# Patient Record
Sex: Male | Born: 1995 | Race: White | Hispanic: No | Marital: Single | Smoking: Never smoker
Health system: Southern US, Community
[De-identification: ages and names within clinical notes are randomized; demographics above are authoritative.]

## PROBLEM LIST (undated history)

## (undated) DIAGNOSIS — T7840XA Allergy, unspecified, initial encounter: Secondary | ICD-10-CM

## (undated) HISTORY — PX: OTHER SURGICAL HISTORY: SHX169

## (undated) HISTORY — DX: Allergy, unspecified, initial encounter: T78.40XA

---
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
## 2014-05-19 ENCOUNTER — Ambulatory Visit (INDEPENDENT_AMBULATORY_CARE_PROVIDER_SITE_OTHER): Payer: BC Managed Care – PPO | Admitting: Family

## 2014-05-19 VITALS — BP 120/80 | HR 70 | Temp 97.8°F | Wt 140.0 lb

## 2014-05-19 DIAGNOSIS — A084 Viral intestinal infection, unspecified: Secondary | ICD-10-CM

## 2014-05-19 DIAGNOSIS — A088 Other specified intestinal infections: Secondary | ICD-10-CM

## 2014-05-19 MED ORDER — ONDANSETRON 4 MG PO TBDP: 4.0000 mg | ORAL_TABLET | Freq: Three times a day (TID) | ORAL | Status: DC | PRN

## 2014-05-19 NOTE — Progress Notes (Signed)
   Subjective:    Patient ID: Zachary Preston, male    DOB: [birth year], 18 y.o.   MRN: [id number]  HPI  Zachary Preston is a 18 yr old male who presents today with chief complaint of abdominal pain. Reports that symptoms started around dinner time 4 days ago.  Developed associated anorexia/nausea.  Reports that during the day he has been "OK" .  Has been taking fluids throughout the day, but nausea worsens at night.  At night he has had some episodes of vomiting. Reports no known fever. Reports BM's have been soft, but denies diarrhea.  He denies current abdominal pain or known sick contacts.     Review of Systems See HPI  Past Medical History  Diagnosis Date  . Allergy     History   Social History  . Marital Status: Single    Spouse Name: N/A    Number of Children: N/A  . Years of Education: N/A   Occupational History  . Not on file.   Social History Main Topics  . Smoking status: Never Smoker   . Smokeless tobacco: Not on file  . Alcohol Use: Not on file  . Drug Use: Not on file  . Sexual Activity: Not on file   Other Topics Concern  . Not on file   Social History Narrative  . No narrative on file    Past Surgical History  Procedure Laterality Date  . Tonsillectomy    . Arm fracture      No family history on file.  No Known Allergies  Current Outpatient Prescriptions on File Prior to Visit  Medication Sig Dispense Refill  . fexofenadine (ALLEGRA) 180 MG tablet Take 180 mg by mouth daily.       No current facility-administered medications on file prior to visit.    BP 120/80  Pulse 70  Temp(Src) 97.8 F (36.6 C) (Oral)  Wt 140 lb (63.504 kg)  SpO2 97%       Objective:   Physical Exam  Constitutional: He is oriented to person, place, and time. He appears well-developed and well-nourished. No distress.  Cardiovascular: Normal rate and regular rhythm.   No murmur heard. Pulmonary/Chest: Effort normal and breath sounds normal. No respiratory distress.  He has no wheezes. He has no rales. He exhibits no tenderness.  Neurological: He is alert and oriented to person, place, and time.  Psychiatric: He has a normal mood and affect. His behavior is normal. Judgment and thought content normal.          Assessment & Plan:

## 2014-05-19 NOTE — Patient Instructions (Signed)

## 2014-05-20 DIAGNOSIS — A084 Viral intestinal infection, unspecified: Secondary | ICD-10-CM | POA: Insufficient documentation

## 2014-05-20 NOTE — Assessment & Plan Note (Signed)
Symptoms most consistent with resolving viral gastroenteritis.  Add zofran prn nausea, follow up if symptoms worsen or if symptoms do not continue to improve.

## 2014-06-11 ENCOUNTER — Telehealth: Payer: Self-pay | Admitting: Family Medicine

## 2014-06-11 NOTE — Telephone Encounter (Signed)
Lm on pts vm requesting a call back to schedule. If pts calls back please schedule him in 2 same-days slots on wednesday

## 2014-06-11 NOTE — Telephone Encounter (Signed)
Patient Information:  Caller Name: Martie LeeSabrina  Phone: [phone]  Patient: Zachary GasmanCaldwell, Zachary  Gender: Male  DOB: 1996/07/13  Age: 18 Years  PCP: Ruthe MannanAron, [facility](Family Practice)  Office Follow Up:  Does the office need to follow up with this patient?: Yes  Instructions For The Office: Nausea HS  RN Note:  Mom calling regarding child/Jamarien who is home from college.  c/o nausea which only occurs HS, but has been going on for past 3 weeks or more.  Mom is unsure if child is having stress issues about returning to school next week or if there is something else going on. Afebrile and denies any other symptoms.  Mom does note that child sleeps later in the day because he is up on computer until early hours of morning.  Requesting to be seen by Dr. Dayton MartesAron for evaluation.  Symptoms  Reason For Call & Symptoms: follow up regarding nausea HS  Reviewed Health History In EMR: Yes  Reviewed Medications In EMR: Yes  Reviewed Allergies In EMR: Yes  Reviewed Surgeries / Procedures: Yes  Date of Onset of Symptoms: 05/11/2014  Guideline(s) Used:  No Protocol Available - Sick Adult  Disposition Per Guideline:   Discuss with PCP and Callback by Nurse Today  Reason For Disposition Reached:   Nursing judgment  Advice Given:  Call Back If:  New symptoms develop  You become worse.  Patient Will Follow Care Advice:  YES

## 2014-06-11 NOTE — Telephone Encounter (Signed)
F/u this week with Dr. Dayton MartesAron

## 2014-06-11 NOTE — Telephone Encounter (Signed)
Zachary Preston said this needs to be a 30 min appt but we dont have any availability for 30 min, at all this week. When would you like to see him?

## 2014-06-11 NOTE — Telephone Encounter (Signed)
Per Okey Dupreose, pt contacted office and scheduled

## 2014-06-11 NOTE — Telephone Encounter (Signed)
Use two same days on weds.

## 2014-06-13 ENCOUNTER — Encounter: Payer: Self-pay | Admitting: Family Medicine

## 2014-06-13 ENCOUNTER — Ambulatory Visit (INDEPENDENT_AMBULATORY_CARE_PROVIDER_SITE_OTHER): Payer: BC Managed Care – PPO | Admitting: Family Medicine

## 2014-06-13 VITALS — BP 118/70 | HR 70 | Temp 97.7°F | Ht 66.5 in | Wt 136.8 lb

## 2014-06-13 DIAGNOSIS — R198 Other specified symptoms and signs involving the digestive system and abdomen: Secondary | ICD-10-CM | POA: Insufficient documentation

## 2014-06-13 DIAGNOSIS — R519 Headache, unspecified: Secondary | ICD-10-CM | POA: Insufficient documentation

## 2014-06-13 DIAGNOSIS — R112 Nausea with vomiting, unspecified: Secondary | ICD-10-CM

## 2014-06-13 DIAGNOSIS — R51 Headache: Secondary | ICD-10-CM

## 2014-06-13 NOTE — Progress Notes (Signed)
Pre visit review using our clinic review tool, if applicable. No additional management support is needed unless otherwise documented below in the visit note. 

## 2014-06-13 NOTE — Assessment & Plan Note (Signed)
New- migrainous in nature but has resolved. Advised to let me know if symptoms reoccur. The patient indicates understanding of these issues and agrees with the plan.

## 2014-06-13 NOTE — Assessment & Plan Note (Addendum)
Deteriorated. Unlikely infectious at this point. One month of nausea with increased frequency of BM (ribbon like) with mucous in stool and increased flatulence.  No blood in stool.  No family h/o IBD. ? IBS vs IBD IBS is a definite possibility given recent stressors. Will start Pepcid 20 mg daily x 2 weeks and start Align daily. Beano/gas x for bloating.  Symptoms journal to look for food triggers.  Refer to GI for possible colonoscopy/endoscopy to rule out Chrohns/UC if symptoms persist.  Consider phenergan prn nausea since zofran ineffective.  He is declining rx at this time.  Less likely "motion sickness" from video games- he has actually been playing less video games due to nausea.  The patient indicates understanding of these issues and agrees with the plan.

## 2014-06-13 NOTE — Assessment & Plan Note (Signed)
New- see above. 

## 2014-06-13 NOTE — Patient Instructions (Signed)
Great to see you. This could be IBS/irritable bowel syndrome but I would like to refer you to a gastroenterologist to rule out inflammatory bowel disease or other possible causes. We will call you with a referral.   Try minimizing stress and foods that worsen symptoms.    Try over the counter gas-x (four times a day when necessary) or beano for bloating.  Peppermint oil can sometimes help with symptoms as well.  Common culprits are hard-to-digest carbs called "FODMAPs"...fermentable, oligo-, di-, and mono-saccharides and polyols...in some fruits, veggies, milk, grains, etc.  Exclude gas producing foods (beans, onions, celery, carrots, raisins, bananas, apricots, prunes, brussel sprouts, wheat germ, pretzels)   Consider trail of lactose free diet (back off milk)   Trial of Align or VSL #3 for bloating/IBS symptoms (OTC).

## 2014-06-13 NOTE — Progress Notes (Addendum)
Subjective:   Patient ID: Zachary Preston, male    DOB: 06/04/1996, 18 y.o.   MRN: [id number]  Zachary Preston is a pleasant 18 y.o. year old male who presents to clinic today with Nausea and Headache  on 06/13/2014  HPI: I have not seen him since he established care with me in 11/2011.  His mom called our office and spoke to triage RN on 06/11/2014- see note in Epic. She was concerned about Zachary Preston who is home from college. He has been complaining of nausea with only occurs at night but has been ongoing for over 2 weeks.  He has been staying up late on his computer until early hours of the morning and sleeping in.  He tells me he is actually not spending as much time on his computer the past few weeks because of the nausea.  He used to play video games approx 6 hours a night- now he doesn't feel like playing them anymore.  Nausea occurs every night around 9 or 10 pm.  This does not start after playing video games. No food triggers he is aware of.  Sometimes vomits.  Bowels have changed as well- increased frequency of "ribbon like stool" but feels he is never having a good BM- still feels bloated and gasy after BM. +mucous in stool, no blood.  No fevers, no mouth ulcers, no epigastric burning or abdominal pain.  Was seen on 7/18 by Zachary Preston about a week after these symptoms started Note reviewed- she felt it was viral gastroenteritis and was given prn Zofran.  Zofran has not helped his symptoms at all.  Also complains of right sided headache with photophobia last two days- resolved this am.  Does take NSAIDs but not frequently.  No FH of IBD. Current Outpatient Prescriptions on File Prior to Visit  Medication Sig Dispense Refill  . fexofenadine (ALLEGRA) 180 MG tablet Take 180 mg by mouth daily.       No current facility-administered medications on file prior to visit.    No Known Allergies  Past Medical History  Diagnosis Date  . Allergy     Past Surgical History    Procedure Laterality Date  . Tonsillectomy    . Arm fracture      No family history on file.  History   Social History  . Marital Status: Single    Spouse Name: N/A    Number of Children: N/A  . Years of Education: N/A   Occupational History  . Not on file.   Social History Main Topics  . Smoking status: Never Smoker   . Smokeless tobacco: Not on file  . Alcohol Use: Not on file  . Drug Use: Not on file  . Sexual Activity: Not on file   Other Topics Concern  . Not on file   Social History Narrative  . No narrative on file   The PMH, PSH, Social History, Family History, Medications, and allergies [facility], and have been updated if relevant.     Review of Systems See HPI No joint pain +anxiety but he is not sure about what- is going back to college at [employer] next Monday.  New roomates. Denies depression No visual changes +photophobia when he had the headache but this resolved. No rashes No night sweats +decreased appetite- has lost 4 pounds since 7/18  Wt Readings from Last 3 Encounters:  06/13/14 136 lb 12 oz (62.029 kg)  05/19/14 140 lb (63.504 kg)  12/16/11 123 lb 12.8 oz (56.155 kg) (11%*, Z = -1.24)   * Growth percentiles are based on CDC 2-20 Years data.       Objective:    BP 118/70  Pulse 70  Temp(Src) 97.7 F (36.5 C) (Oral)  Ht 5' 6.5" (1.689 m)  Wt 136 lb 12 oz (62.029 kg)  BMI 21.74 kg/m2  SpO2 97%   Physical Exam  Nursing note and vitals reviewed. Constitutional: He is oriented to person, place, and time. He appears well-developed and well-nourished. No distress.  HENT:  Head: Normocephalic.  Neck:  Neg dix hallpike  Abdominal: Soft. Bowel sounds are normal. He exhibits no distension and no mass. There is no tenderness. There is no rebound and no guarding.  Musculoskeletal: Normal range of motion. He exhibits no edema and no tenderness.  No clubbing No nail changes  Neurological: He is alert and oriented to  person, place, and time. He exhibits normal muscle tone.  Skin: Skin is warm and dry. No rash noted. No erythema.  Psychiatric: He has a normal mood and affect. His behavior is normal. Judgment and thought content normal.          Assessment & Plan:   No diagnosis found. No Follow-up on file.

## 2014-07-30 ENCOUNTER — Encounter: Payer: Self-pay | Admitting: Family Medicine

## 2016-06-22 ENCOUNTER — Encounter: Payer: Self-pay | Admitting: Family Medicine

## 2016-06-22 ENCOUNTER — Ambulatory Visit (INDEPENDENT_AMBULATORY_CARE_PROVIDER_SITE_OTHER): Payer: BC Managed Care – PPO | Admitting: Family Medicine

## 2016-06-22 VITALS — BP 122/64 | HR 108 | Temp 98.4°F | Ht 66.0 in | Wt 151.5 lb

## 2016-06-22 DIAGNOSIS — Z111 Encounter for screening for respiratory tuberculosis: Secondary | ICD-10-CM

## 2016-06-22 DIAGNOSIS — Z0000 Encounter for general adult medical examination without abnormal findings: Secondary | ICD-10-CM

## 2016-06-22 NOTE — Progress Notes (Signed)
   Subjective:   Patient ID: Zachary Preston, male    DOB: 1996-04-07, 20 y.o.   MRN: [id number]  Zachary Paan H Davlin is a pleasant 20 y.o. year old male who presents to clinic today with Annual Exam  on 06/22/2016  HPI:  No concerns.    Needs form filled out which he brings with him stating he can work around children- ie no limitations.  Just finished college.  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  Current Outpatient Prescriptions on File Prior to Visit  Medication Sig Dispense Refill  . fexofenadine (ALLEGRA) 180 MG tablet Take 180 mg by mouth daily.     No current facility-administered medications on file prior to visit.     No Known Allergies  Past Medical History:  Diagnosis Date  . Allergy     Past Surgical History:  Procedure Laterality Date  . arm fracture    . tonsillectomy      No family history on file.  Social History   Social History  . Marital status: Single    Spouse name: N/A  . Number of children: N/A  . Years of education: N/A   Occupational History  . Not on file.   Social History Main Topics  . Smoking status: Never Smoker  . Smokeless tobacco: Not on file  . Alcohol use Not on file  . Drug use: Unknown  . Sexual activity: Not on file   Other Topics Concern  . Not on file   Social History Narrative  . No narrative on file   The PMH, PSH, Social History, Family History, Medications, and allergies [facility], and have been updated if relevant.   Review of Systems  Constitutional: Negative.   Respiratory: Negative.   Cardiovascular: Negative.   Gastrointestinal: Negative.   Endocrine: Negative.   Genitourinary: Negative.   Musculoskeletal: Negative.   Allergic/Immunologic: Negative.   Neurological: Negative.   Hematological: Negative.   Psychiatric/Behavioral: Negative.   All other systems reviewed and are negative.      Objective:    BP 122/64   Pulse (!) 108   Temp 98.4 F (36.9 C) (Oral)   Ht 5\' 6"  (1.676 m)   Wt 151 lb  8 oz (68.7 kg)   SpO2 95%   BMI 24.45 kg/m    Physical Exam  General:  Pleasant male, NAD Eyes:  PERRL Ears:  External ear exam shows no significant lesions or deformities.  Otoscopic examination reveals clear canals, tympanic membranes are intact bilaterally without bulging, retraction, inflammation or discharge. Hearing is grossly normal bilaterally. Nose:  External nasal examination shows no deformity or inflammation. Nasal mucosa are pink and moist without lesions or exudates. Mouth:  Oral mucosa and oropharynx without lesions or exudates.  Teeth in good repair. Neck:  no carotid bruit or thyromegaly no cervical or supraclavicular lymphadenopathy  Lungs:  Normal respiratory effort, chest expands symmetrically. Lungs are clear to auscultation, no crackles or wheezes. Heart:  Normal rate and regular rhythm. S1 and S2 normal without gallop, murmur, click, rub or other extra sounds. Abdomen:  Bowel sounds positive,abdomen soft and non-tender without masses, organomegaly or hernias noted. Pulses:  R and L posterior tibial pulses are full and equal bilaterally  Extremities:  no edema        Assessment & Plan:   Visit for well man health check No Follow-up on file.

## 2016-06-22 NOTE — Progress Notes (Signed)
Pre visit review using our clinic review tool, if applicable. No additional management support is needed unless otherwise documented below in the visit note. 

## 2016-06-22 NOTE — Addendum Note (Signed)
Addended by: Desmond DikeKNIGHT, Dequante Tremaine H on: 06/22/2016 02:25 PM   Modules accepted: Orders

## 2016-06-22 NOTE — Assessment & Plan Note (Signed)
Discussed dangers of smoking, alcohol, and drug abuse.  Also discussed sexual activity, and STD risk.  Encouraged to get regular exercise.  Refusing labs.

## 2016-06-25 LAB — TB SKIN TEST
Induration: 0 mm
TB SKIN TEST: NEGATIVE

## 2016-07-30 ENCOUNTER — Encounter: Payer: Self-pay | Admitting: Internal Medicine

## 2016-07-30 ENCOUNTER — Ambulatory Visit (INDEPENDENT_AMBULATORY_CARE_PROVIDER_SITE_OTHER): Payer: BC Managed Care – PPO | Admitting: Internal Medicine

## 2016-07-30 VITALS — BP 118/78 | HR 80 | Temp 98.2°F | Wt 150.5 lb

## 2016-07-30 DIAGNOSIS — J019 Acute sinusitis, unspecified: Secondary | ICD-10-CM

## 2016-07-30 MED ORDER — AMOXICILLIN 500 MG PO CAPS: 500.0000 mg | ORAL_CAPSULE | Freq: Three times a day (TID) | ORAL | 0 refills | Status: DC

## 2016-07-30 NOTE — Progress Notes (Signed)
HPI  Pt presents to the clinic today with c/o nasal congestion, sore throat and cough. He reports this started about 5 days ago. He is blowing green mucous out of his nose. He denies difficulty swallowing. The cough is nonproductive. He denies fever, chills or body aches. He has tried Careers adviserAllegra, Dayquil and Nyquil with minimal relief. He has a history of seasonal allergies but has not had sick contacts.  Review of Systems    Past Medical History:  Diagnosis Date  . Allergy     No family history on file.  Social History   Social History  . Marital status: Single    Spouse name: N/A  . Number of children: N/A  . Years of education: N/A   Occupational History  . Not on file.   Social History Main Topics  . Smoking status: Never Smoker  . Smokeless tobacco: Not on file  . Alcohol use Not on file  . Drug use: Unknown  . Sexual activity: Not on file   Other Topics Concern  . Not on file   Social History Narrative  . No narrative on file    No Known Allergies   Constitutional: Denies headache, fatigue fever, abrupt weight changes.  HEENT:  Positive nasal congestion and sore throat. Denies eye redness, ear pain, ringing in the ears, wax buildup, runny nose or bloody nose. Respiratory: Positive cough. Denies difficulty breathing or shortness of breath.  Cardiovascular: Denies chest pain, chest tightness, palpitations or swelling in the hands or feet.   No other specific complaints in a complete review of systems (except as listed in HPI above).  Objective:   BP 118/78   Pulse 80   Temp 98.2 F (36.8 C) (Oral)   Wt 150 lb 8 oz (68.3 kg)   SpO2 98%   BMI 24.29 kg/m   General: Appears his stated age,  in NAD. HEENT: Head: normal shape and size, no sinus tenderness noted; Eyes: sclera white, no icterus, conjunctiva pink; Ears: Tm's gray and intact, normal light reflex; Nose: mucosa boggy and moist, septum midline; Throat/Mouth: + PND. Teeth present, mucosa erythematous  and moist, no exudate noted, no lesions or ulcerations noted.  Neck:  No adenopathy noted.  Cardiovascular: Normal rate and rhythm. S1,S2 noted.  No murmur, rubs or gallops noted.  Pulmonary/Chest: Normal effort and positive vesicular breath sounds. No respiratory distress. No wheezes, rales or ronchi noted.      Assessment & Plan:   Acute bacterial sinusitis  Can use a Neti Pot which can be purchased from your local drug store. Flonase 2 sprays each nostril for 3 days and then as needed. Continue Allegra eRx for Amoxil TID for 10 days to start taking on Sunday if no improvement  RTC as needed or if symptoms persist. Nicki ReaperBAITY, Jaxn Chiquito, NP

## 2016-07-30 NOTE — Patient Instructions (Signed)

## 2017-02-24 ENCOUNTER — Other Ambulatory Visit: Payer: Self-pay | Admitting: Internal Medicine

## 2017-02-24 ENCOUNTER — Encounter: Payer: Self-pay | Admitting: Family Medicine

## 2017-03-01 ENCOUNTER — Ambulatory Visit: Payer: BC Managed Care – PPO | Admitting: Internal Medicine

## 2017-08-02 ENCOUNTER — Encounter: Payer: Self-pay | Admitting: Internal Medicine

## 2017-08-02 ENCOUNTER — Ambulatory Visit (INDEPENDENT_AMBULATORY_CARE_PROVIDER_SITE_OTHER): Payer: BC Managed Care – PPO | Admitting: Internal Medicine

## 2017-08-02 VITALS — BP 116/80 | HR 93 | Temp 98.6°F | Wt 159.0 lb

## 2017-08-02 DIAGNOSIS — R059 Cough, unspecified: Secondary | ICD-10-CM

## 2017-08-02 DIAGNOSIS — J189 Pneumonia, unspecified organism: Secondary | ICD-10-CM | POA: Diagnosis not present

## 2017-08-02 DIAGNOSIS — R05 Cough: Secondary | ICD-10-CM

## 2017-08-02 MED ORDER — AZITHROMYCIN 250 MG PO TABS: ORAL_TABLET | ORAL | 0 refills | Status: DC

## 2017-08-02 NOTE — Patient Instructions (Signed)

## 2017-08-02 NOTE — Progress Notes (Signed)
   Subjective:    Patient ID: Zachary Preston, male    DOB: 13-May-1996, 21 y.o.   MRN: [id number]  HPI  Pt presents to the clinic today with c/o a cough. He reports this started 2 weeks ago. The cough is productive of green mucous. He denies runny nose, nasal congestion, ear pain or sore throat. He denies fever or chills but he has had some body aches. He has tried Mucinex without any relief. He has a history of allergies but denies breathing problems. He has not had sick contacts.  Review of Systems      Past Medical History:  Diagnosis Date  . Allergy     Current Outpatient Prescriptions  Medication Sig Dispense Refill  . fexofenadine (ALLEGRA) 180 MG tablet Take 180 mg by mouth daily.     No current facility-administered medications for this visit.     No Known Allergies  History reviewed. No pertinent family history.  Social History   Social History  . Marital status: Single    Spouse name: N/A  . Number of children: N/A  . Years of education: N/A   Occupational History  . Not on file.   Social History Main Topics  . Smoking status: Never Smoker  . Smokeless tobacco: Never Used  . Alcohol use Not on file  . Drug use: Unknown  . Sexual activity: Not on file   Other Topics Concern  . Not on file   Social History Narrative  . No narrative on file     Constitutional: Denies fever, malaise, fatigue, headache or abrupt weight changes.  HEENT: Denies eye pain, eye redness, ear pain, ringing in the ears, wax buildup, runny nose, nasal congestion, bloody nose, or sore throat. Respiratory: Pt reports cough. Denies difficulty breathing, shortness of breath.   Cardiovascular: Denies chest pain, chest tightness, palpitations or swelling in the hands or feet.    No other specific complaints in a complete review of systems (except as listed in HPI above).  Objective:   Physical Exam   BP 116/80   Pulse 93   Temp 98.6 F (37 C) (Oral)   Wt 159 lb (72.1 kg)    SpO2 98%   BMI 25.66 kg/m  Wt Readings from Last 3 Encounters:  08/02/17 159 lb (72.1 kg)  07/30/16 150 lb 8 oz (68.3 kg)  06/22/16 151 lb 8 oz (68.7 kg)    General: Appears his stated age, in NAD. HEENT: Head: normal shape and size; Throat/Mouth: Teeth present, mucosa pink and moist, no exudate, lesions or ulcerations noted.  Neck:  No adenopathy noted. Cardiovascular: Normal rate and rhythm.  Pulmonary/Chest: Normal effort and crackles noted in the RLL. No respiratory distress. No wheezes or ronchi noted.          Assessment & Plan:   Cough, likely Walking Pneumonia:  eRx for Azithromax x 5 days Ok to continue Mucinex if needed Delsym as needed for cough Discussed the importance of fluids and rest  Return precautions discussed Nicki Reaper, NP

## 2018-10-04 ENCOUNTER — Encounter: Payer: Self-pay | Admitting: Family Medicine

## 2018-10-04 ENCOUNTER — Ambulatory Visit: Payer: BC Managed Care – PPO | Admitting: Family Medicine

## 2018-10-04 VITALS — BP 102/70 | HR 82 | Temp 98.1°F | Wt 152.8 lb

## 2018-10-04 DIAGNOSIS — R05 Cough: Secondary | ICD-10-CM

## 2018-10-04 DIAGNOSIS — R059 Cough, unspecified: Secondary | ICD-10-CM

## 2018-10-04 MED ORDER — AZITHROMYCIN 250 MG PO TABS: ORAL_TABLET | ORAL | 0 refills | Status: DC

## 2018-10-04 NOTE — Progress Notes (Signed)
Zachary Preston - 22 y.o. male MRN [id number]  Date of birth: [birth year]  Subjective Chief Complaint  Patient presents with  . Fever    started a week ago   . Nasal Congestion  . Generalized Body Aches  . Cough    HPI Zachary Paan H Jamil is a 22 y.o. male here today with complaint of cough, congestion, fever, and body aches.  He reports that he initially had symptoms that started last week with fever, congestion, body aches and cough.  This began improving after a couple of days but last night he began feeling worse again with fever, increased cough, body aches,  and congestion.  He denies sinus pain, nausea, vomiting, diarrhea, rash, shortness of breath or wheezing.  Had atypical pneumonia last year and this feels similar.    ROS:  A comprehensive ROS was completed and negative except as noted per HPI  No Known Allergies  Past Medical History:  Diagnosis Date  . Allergy     Past Surgical History:  Procedure Laterality Date  . arm fracture    . tonsillectomy      Social History   Socioeconomic History  . Marital status: Single    Spouse name: Not on file  . Number of children: Not on file  . Years of education: Not on file  . Highest education level: Not on file  Occupational History  . Not on file  Social Needs  . Financial resource strain: Not on file  . Food insecurity:    Worry: Not on file    Inability: Not on file  . Transportation needs:    Medical: Not on file    Non-medical: Not on file  Tobacco Use  . Smoking status: Never Smoker  . Smokeless tobacco: Never Used  Substance and Sexual Activity  . Alcohol use: Not on file  . Drug use: Not on file  . Sexual activity: Not on file  Lifestyle  . Physical activity:    Days per week: Not on file    Minutes per session: Not on file  . Stress: Not on file  Relationships  . Social connections:    Talks on phone: Not on file    Gets together: Not on file    Attends religious service: Not on file    Active  member of club or organization: Not on file    Attends meetings of clubs or organizations: Not on file    Relationship status: Not on file  Other Topics Concern  . Not on file  Social History Narrative  . Not on file    No family history on file.  Health Maintenance  Topic Date Due  . HIV Screening  11/19/2008  . TETANUS/TDAP  11/19/2012  . INFLUENZA VACCINE  06/02/2018    ----------------------------------------------------------------------------------------------------------------------------------------------------------------------------------------------------------------- Physical Exam BP 102/70   Pulse 82   Temp 98.1 F (36.7 C) (Oral)   Wt 152 lb 12.8 oz (69.3 kg)   SpO2 97%   BMI 24.66 kg/m   Physical Exam  Constitutional: He is oriented to person, place, and time. He appears well-nourished. No distress.  HENT:  Head: Normocephalic and atraumatic.  Mouth/Throat: Oropharynx is clear and moist.  Eyes: No scleral icterus.  Neck: Neck supple. No thyromegaly present.  Cardiovascular: Normal rate, regular rhythm and normal heart sounds.  Pulmonary/Chest: Effort normal and breath sounds normal. No respiratory distress. He has no wheezes.  Lymphadenopathy:    He has no cervical adenopathy.  Neurological: He is alert  and oriented to person, place, and time.  Skin: Skin is warm and dry.  Psychiatric: He has a normal mood and affect. His behavior is normal.    ------------------------------------------------------------------------------------------------------------------------------------------------------------------------------------------------------------------- Assessment and Plan  Cough -Similar symptoms to previous episode of atypical pneumonia however lungs clear on exam today. .  -Given improvement then worsening again of symptoms will cover for bacterial infection with azithromycin.  -Recommend increased fluids -He may continue supportive treatment with  OTC medications.

## 2018-10-04 NOTE — Patient Instructions (Signed)
Start z-pack Increase fluid intake You may continue symptomatic and supportive treatment with over the counter medications Call for worsening symptoms or if not improving with recommended treatment.

## 2018-10-04 NOTE — Assessment & Plan Note (Signed)
-  Similar symptoms to previous episode of atypical pneumonia however lungs clear on exam today. .  -Given improvement then worsening again of symptoms will cover for bacterial infection with azithromycin.  -Recommend increased fluids -He may continue supportive treatment with OTC medications.

## 2018-12-19 ENCOUNTER — Ambulatory Visit: Payer: BC Managed Care – PPO | Admitting: Family Medicine

## 2018-12-19 ENCOUNTER — Encounter: Payer: Self-pay | Admitting: Family Medicine

## 2018-12-19 VITALS — BP 124/78 | HR 119 | Temp 102.0°F | Ht 66.0 in | Wt 153.8 lb

## 2018-12-19 DIAGNOSIS — R070 Pain in throat: Secondary | ICD-10-CM

## 2018-12-19 DIAGNOSIS — R509 Fever, unspecified: Secondary | ICD-10-CM

## 2018-12-19 DIAGNOSIS — R52 Pain, unspecified: Secondary | ICD-10-CM

## 2018-12-19 DIAGNOSIS — J101 Influenza due to other identified influenza virus with other respiratory manifestations: Secondary | ICD-10-CM | POA: Diagnosis not present

## 2018-12-19 LAB — POCT RAPID STREP A (OFFICE): RAPID STREP A SCREEN: NEGATIVE

## 2018-12-19 LAB — POC INFLUENZA A&B (BINAX/QUICKVUE)
INFLUENZA B, POC: NEGATIVE
Influenza A, POC: POSITIVE — AB

## 2018-12-19 MED ORDER — OSELTAMIVIR PHOSPHATE 75 MG PO CAPS: 75.0000 mg | ORAL_CAPSULE | Freq: Two times a day (BID) | ORAL | 0 refills | Status: AC

## 2018-12-19 NOTE — Progress Notes (Signed)
SUBJECTIVE:  Zachary Preston is a 23 y.o. male who present complaining of flu-like symptoms: fevers, chills, myalgias, congestion, sore throat and cough for 2 days. Denies dyspnea or wheezing.  Current Outpatient Medications on File Prior to Visit  Medication Sig Dispense Refill  . fexofenadine (ALLEGRA) 180 MG tablet Take 180 mg by mouth daily.     No current facility-administered medications on file prior to visit.     No Known Allergies  Past Medical History:  Diagnosis Date  . Allergy     Past Surgical History:  Procedure Laterality Date  . arm fracture    . tonsillectomy      No family history on file.  Social History   Socioeconomic History  . Marital status: Single    Spouse name: Not on file  . Number of children: Not on file  . Years of education: Not on file  . Highest education level: Not on file  Occupational History  . Not on file  Social Needs  . Financial resource strain: Not on file  . Food insecurity:    Worry: Not on file    Inability: Not on file  . Transportation needs:    Medical: Not on file    Non-medical: Not on file  Tobacco Use  . Smoking status: Never Smoker  . Smokeless tobacco: Never Used  Substance and Sexual Activity  . Alcohol use: Not on file  . Drug use: Not on file  . Sexual activity: Not on file  Lifestyle  . Physical activity:    Days per week: Not on file    Minutes per session: Not on file  . Stress: Not on file  Relationships  . Social connections:    Talks on phone: Not on file    Gets together: Not on file    Attends religious service: Not on file    Active member of club or organization: Not on file    Attends meetings of clubs or organizations: Not on file    Relationship status: Not on file  . Intimate partner violence:    Fear of current or ex partner: Not on file    Emotionally abused: Not on file    Physically abused: Not on file    Forced sexual activity: Not on file  Other Topics Concern  . Not on  file  Social History Narrative  . Not on file   The PMH, PSH, Social History, Family History, Medications, and allergies have been reviewed in [employer], and have been updated if relevant.  OBJECTIVE: BP 124/78 (BP Location: Left Arm, Patient Position: Sitting, Cuff Size: Normal)   Pulse (!) 119   Temp (!) 102 F (38.9 C) (Oral)   Ht 5\' 6"  (1.676 m)   Wt 153 lb 12.8 oz (69.8 kg)   SpO2 94%   BMI 24.82 kg/m   Appears moderately ill but not toxic; temperature as noted in vitals. Ears normal. Throat and pharynx normal.  Neck supple. No adenopathy in the neck. Sinuses non tender. The chest is clear.  ASSESSMENT: Influenza  PLAN: Rapid flu positive for influenza A. tamiflu 75 mg twice daily x 5 days. Symptomatic therapy suggested: rest, increase fluids and call prn if symptoms persist or worsen. Call or return to clinic prn if these symptoms worsen or fail to improve as anticipated.

## 2018-12-19 NOTE — Patient Instructions (Signed)

## 2019-07-05 ENCOUNTER — Telehealth (INDEPENDENT_AMBULATORY_CARE_PROVIDER_SITE_OTHER): Payer: BC Managed Care – PPO | Admitting: Family Medicine

## 2019-07-05 ENCOUNTER — Encounter: Payer: Self-pay | Admitting: Family Medicine

## 2019-07-05 ENCOUNTER — Other Ambulatory Visit: Payer: Self-pay | Admitting: *Deleted

## 2019-07-05 DIAGNOSIS — R05 Cough: Secondary | ICD-10-CM | POA: Diagnosis not present

## 2019-07-05 DIAGNOSIS — R0981 Nasal congestion: Secondary | ICD-10-CM

## 2019-07-05 DIAGNOSIS — R509 Fever, unspecified: Secondary | ICD-10-CM | POA: Diagnosis not present

## 2019-07-05 DIAGNOSIS — Z20828 Contact with and (suspected) exposure to other viral communicable diseases: Secondary | ICD-10-CM | POA: Diagnosis not present

## 2019-07-05 DIAGNOSIS — Z20822 Contact with and (suspected) exposure to covid-19: Secondary | ICD-10-CM

## 2019-07-05 NOTE — Assessment & Plan Note (Signed)
-  COVID -19 testing ordered -Instructed to remain isolated until test results return.  -He may continue OTC supportive treatment as needed.  -Enrolled my MyChart companion app for monitoring of symptoms.   -Discussed red flag and reasons to seek emergency care.

## 2019-07-05 NOTE — Progress Notes (Signed)
Zachary Preston - 23 y.o. male MRN [id number]  Date of birth: 1996-04-10   This visit type was conducted due to national recommendations for restrictions regarding the COVID-19 Pandemic (e.g. social distancing).  This format is felt to be most appropriate for this patient at this time.  All issues noted in this document were discussed and addressed.  No physical exam was performed (except for noted visual exam findings with Video Visits).  I discussed the limitations of evaluation and management by telemedicine and the availability of in person appointments. The patient expressed understanding and agreed to proceed.  I connected with@ on 07/05/19 at  1:15 PM EDT by a video enabled telemedicine application and verified that I am speaking with the correct person using two identifiers.   Patient Location: Home [place] [address]   Provider location:   [facility]  Chief Complaint  Patient presents with  . covid symptoms    HPI  Zachary Preston is a 23 y.o. male who presents via audio/video conferencing for a telehealth visit today.  He has concerns of COVID like symptoms.  He reports 4 day history of fever, chills, mild cough and congestion.  He checked temp yesterday and it was 99.2 but this was after taking dayquil.  He denies body aches, nausea, vomiting, chest pain, shortness of breath or headache.    ROS:  A comprehensive ROS was completed and negative except as noted per HPI  Past Medical History:  Diagnosis Date  . Allergy     Past Surgical History:  Procedure Laterality Date  . arm fracture    . tonsillectomy      History reviewed. No pertinent family history.  Social History   Socioeconomic History  . Marital status: Single    Spouse name: Not on file  . Number of children: Not on file  . Years of education: Not on file  . Highest education level: Not on file  Occupational History  . Not on file  Social Needs  . Financial resource  strain: Not on file  . Food insecurity    Worry: Not on file    Inability: Not on file  . Transportation needs    Medical: Not on file    Non-medical: Not on file  Tobacco Use  . Smoking status: Never Smoker  . Smokeless tobacco: Never Used  Substance and Sexual Activity  . Alcohol use: Not on file  . Drug use: Not on file  . Sexual activity: Not on file  Lifestyle  . Physical activity    Days per week: Not on file    Minutes per session: Not on file  . Stress: Not on file  Relationships  . Social Herbalist on phone: Not on file    Gets together: Not on file    Attends religious service: Not on file    Active member of club or organization: Not on file    Attends meetings of clubs or organizations: Not on file    Relationship status: Not on file  . Intimate partner violence    Fear of current or ex partner: Not on file    Emotionally abused: Not on file    Physically abused: Not on file    Forced sexual activity: Not on file  Other Topics Concern  . Not on file  Social History Narrative  . Not on file     Current Outpatient Medications:  .  fexofenadine (ALLEGRA)  180 MG tablet, Take 180 mg by mouth daily., Disp: , Rfl:   EXAM:  VITALS per patient if applicable: There were no vitals taken for this visit.  GENERAL: alert, oriented, appears well and in no acute distress  HEENT: atraumatic, conjunttiva clear, no obvious abnormalities on inspection of external nose and ears  NECK: normal movements of the head and neck  LUNGS: on inspection no signs of respiratory distress, breathing rate appears normal, no obvious gross SOB, gasping or wheezing  CV: no obvious cyanosis  MS: moves all visible extremities without noticeable abnormality  PSYCH/NEURO: pleasant and cooperative, no obvious depression or anxiety, speech and thought processing grossly intact  ASSESSMENT AND PLAN:  Discussed the following assessment and plan:  Suspected Covid-19 Virus  Infection -COVID -19 testing ordered -Instructed to remain isolated until test results return.  -He may continue OTC supportive treatment as needed.  -Enrolled my MyChart companion app for monitoring of symptoms.   -Discussed red flag and reasons to seek emergency care.        I discussed the assessment and treatment plan with the patient. The patient was provided an opportunity to ask questions and all were answered. The patient agreed with the plan and demonstrated an understanding of the instructions.   The patient was advised to call back or seek an in-person evaluation if the symptoms worsen or if the condition fails to improve as anticipated.   Everrett Coombeody Damia Bobrowski, DO

## 2019-07-06 LAB — NOVEL CORONAVIRUS, NAA: SARS-CoV-2, NAA: NOT DETECTED

## 2019-08-29 ENCOUNTER — Other Ambulatory Visit: Payer: Self-pay

## 2019-08-29 DIAGNOSIS — Z20822 Contact with and (suspected) exposure to covid-19: Secondary | ICD-10-CM

## 2019-08-31 LAB — NOVEL CORONAVIRUS, NAA: SARS-CoV-2, NAA: NOT DETECTED

## 2019-12-30 ENCOUNTER — Ambulatory Visit: Payer: BC Managed Care – PPO | Attending: Internal Medicine

## 2019-12-30 DIAGNOSIS — Z23 Encounter for immunization: Secondary | ICD-10-CM | POA: Insufficient documentation

## 2019-12-30 NOTE — Progress Notes (Signed)
   Covid-19 Vaccination Clinic  Name:  Zachary Preston    MRN: [id number] DOB: 07-26-1996  12/30/2019  Zachary Preston was observed post Covid-19 immunization for 15 minutes without incidence. He was provided with Vaccine Information Sheet and instruction to access the V-Safe system.   Zachary Preston was instructed to call 911 with any severe reactions post vaccine: Marland Kitchen Difficulty breathing  . Swelling of your face and throat  . A fast heartbeat  . A bad rash all over your body  . Dizziness and weakness    Immunizations Administered    Name Date Dose VIS Date Route   Pfizer COVID-19 Vaccine 12/30/2019  5:55 PM 0.3 mL 10/13/2019 Intramuscular   Manufacturer: [employer], [employer]   Lot: [id number]   NDC: [address]-2

## 2020-01-20 ENCOUNTER — Ambulatory Visit: Payer: BC Managed Care – PPO | Attending: Internal Medicine

## 2020-01-20 DIAGNOSIS — Z23 Encounter for immunization: Secondary | ICD-10-CM

## 2020-01-20 NOTE — Progress Notes (Signed)
   Covid-19 Vaccination Clinic  Name:  Zachary Preston    MRN: [id number] DOB: 12/06/1995  01/20/2020  [name] was observed post Covid-19 immunization for 15 minutes without incident. He was provided with Vaccine Information Sheet and instruction to access the V-Safe system.   [name] was instructed to call 911 with any severe reactions post vaccine: Marland Kitchen Difficulty breathing  . Swelling of face and throat  . A fast heartbeat  . A bad rash all over body  . Dizziness and weakness   Immunizations Administered    Name Date Dose VIS Date Route   Pfizer COVID-19 Vaccine 01/20/2020  2:42 PM 0.3 mL 10/13/2019 Intramuscular   Manufacturer: [employer], [employer]   Lot: [id number]   NDC: [address]-2

## 2020-07-24 ENCOUNTER — Ambulatory Visit
Admission: RE | Admit: 2020-07-24 | Discharge: 2020-07-24 | Disposition: A | Discharge status: Home / Self Care | Payer: BC Managed Care – PPO | Source: Ambulatory Visit | Attending: Family Medicine | Admitting: Family Medicine

## 2020-07-24 ENCOUNTER — Other Ambulatory Visit: Payer: Self-pay

## 2020-07-24 VITALS — BP 128/77 | HR 102 | Temp 98.8°F | Resp 18

## 2020-07-24 DIAGNOSIS — B349 Viral infection, unspecified: Secondary | ICD-10-CM | POA: Diagnosis not present

## 2020-07-24 DIAGNOSIS — R059 Cough, unspecified: Secondary | ICD-10-CM

## 2020-07-24 DIAGNOSIS — R5383 Other fatigue: Secondary | ICD-10-CM

## 2020-07-24 DIAGNOSIS — R0602 Shortness of breath: Secondary | ICD-10-CM | POA: Diagnosis not present

## 2020-07-24 DIAGNOSIS — R05 Cough: Secondary | ICD-10-CM

## 2020-07-24 DIAGNOSIS — R000 Tachycardia, unspecified: Secondary | ICD-10-CM

## 2020-07-24 MED ORDER — AZITHROMYCIN 250 MG PO TABS: 250.0000 mg | ORAL_TABLET | Freq: Every day | ORAL | 0 refills | Status: DC

## 2020-07-24 MED ORDER — PREDNISONE 10 MG (21) PO TBPK: ORAL_TABLET | Freq: Every day | ORAL | 0 refills | Status: DC

## 2020-07-24 MED ORDER — ALBUTEROL SULFATE HFA 108 (90 BASE) MCG/ACT IN AERS: 2.0000 | INHALATION_SPRAY | RESPIRATORY_TRACT | 0 refills | Status: AC | PRN

## 2020-07-24 MED ORDER — BENZONATATE 100 MG PO CAPS: 100.0000 mg | ORAL_CAPSULE | Freq: Three times a day (TID) | ORAL | 0 refills | Status: DC

## 2020-07-24 NOTE — ED Triage Notes (Addendum)
Pt is here with SOB and cough that started last Monday, pt has taken Delynn Flavin to relieve discomfort. Pt tested POSITIVE for COVID on 07/10/2020

## 2020-07-24 NOTE — Discharge Instructions (Addendum)
I think that you are experiencing Covid pneumonia  I have sent in a steroid taper for you to take as well. Take 6 tablets for the first two days, take 5 tablets for day three and four, take 4 tablets for days five and six, take 3 tablets for days seven and eight, take 2 tablets for day nine and ten, then take 1 tablet for days eleven and twelve.  I have sent in azithromycin. Take 2 tablets today and one tablet daily for the next 4 days.  I have also sent in an albuterol inhaler for you to use 2 puffs every 4-6 hours as needed for wheezing, shortness of breath, cough  I have sent in tessalon perles for you to use for cough  Follow up with this office or with primary care as needed  Follow up with the ER for trouble swallowing, trouble breathing, other concerning symptoms

## 2020-07-25 NOTE — ED Provider Notes (Signed)
[facility]   [id number] 07/24/20 Arrival Time: 1639   CC: COVID symptoms  SUBJECTIVE: History from: patient.  Zachary Preston is a 24 y.o. male who presents with abrupt onset of SOB, chest tightness, PND, cough for the last 2 weeks. Reports that he was Covid positive on 07/10/20. Denies having these symptoms during his quarantine period. Has not completed Covid vaccines. Has not taken OTC medications for this. There are no aggravating or alleviating factors. Denies previous symptoms in the past. Denies fever, chills, fatigue, sinus pain, rhinorrhea, sore throat, wheezing, chest pain, nausea, changes in bowel or bladder habits.    ROS: As per HPI.  All other pertinent ROS negative.     Past Medical History:  Diagnosis Date  . Allergy    Past Surgical History:  Procedure Laterality Date  . arm fracture    . tonsillectomy     No Known Allergies No current facility-administered medications on file prior to encounter.   Current Outpatient Medications on File Prior to Encounter  Medication Sig Dispense Refill  . fexofenadine (ALLEGRA) 180 MG tablet Take 180 mg by mouth daily.     Social History   Socioeconomic History  . Marital status: Single    Spouse name: Not on file  . Number of children: Not on file  . Years of education: Not on file  . Highest education level: Not on file  Occupational History  . Not on file  Tobacco Use  . Smoking status: Never Smoker  . Smokeless tobacco: Never Used  Substance and Sexual Activity  . Alcohol use: Yes  . Drug use: Never  . Sexual activity: Yes    Birth control/protection: None  Other Topics Concern  . Not on file  Social History Narrative  . Not on file   Social Determinants of Health   Financial Resource Strain:   . Difficulty of Paying Living Expenses: Not on file  Food Insecurity:   . Worried About Programme researcher, broadcasting/film/video in the Last Year: Not on file  . Ran Out of Food in the Last Year: Not on file   Transportation Needs:   . Lack of Transportation (Medical): Not on file  . Lack of Transportation (Non-Medical): Not on file  Physical Activity:   . Days of Exercise per Week: Not on file  . Minutes of Exercise per Session: Not on file  Stress:   . Feeling of Stress : Not on file  Social Connections:   . Frequency of Communication with Friends and Family: Not on file  . Frequency of Social Gatherings with Friends and Family: Not on file  . Attends Religious Services: Not on file  . Active Member of Clubs or Organizations: Not on file  . Attends Banker Meetings: Not on file  . Marital Status: Not on file  Intimate Partner Violence:   . Fear of Current or Ex-Partner: Not on file  . Emotionally Abused: Not on file  . Physically Abused: Not on file  . Sexually Abused: Not on file   Family History  Problem Relation Age of Onset  . Healthy Mother   . Hypertension Father     OBJECTIVE:  Vitals:   07/24/20 1700  BP: 128/77  Pulse: (!) 102  Resp: 18  Temp: 98.8 F (37.1 C)  TempSrc: Oral  SpO2: 94%     General appearance: alert; appears fatigued, but nontoxic; speaking in full sentences and tolerating own secretions HEENT: NCAT; Ears: EACs clear, TMs pearly  gray; Eyes: PERRL.  EOM grossly intact. Sinuses: nontender; Nose: nares patent without rhinorrhea, Throat: oropharynx clear, tonsils non erythematous or enlarged, uvula midline  Neck: supple with LAD Lungs: unlabored respirations, symmetrical air entry; cough: moderate; no respiratory distress; coarse lung sounds to bilateral lower lobes, wheezing to upper lobes Heart: regular rate and rhythm.  Radial pulses 2+ symmetrical bilaterally Skin: warm and dry Psychological: alert and cooperative; normal mood and affect  LABS:  No results found for this or any previous visit (from the past 24 hour(s)).   ASSESSMENT & PLAN:  1. Viral illness   2. Cough   3. SOB (shortness of breath)   4. Other fatigue   5.  Tachycardia     Meds ordered this encounter  Medications  . predniSONE (STERAPRED UNI-PAK 21 TAB) 10 MG (21) TBPK tablet    Sig: Take by mouth daily. Take 6 tabs by mouth daily  for 2 days, then 5 tabs for 2 days, then 4 tabs for 2 days, then 3 tabs for 2 days, 2 tabs for 2 days, then 1 tab by mouth daily for 2 days    Dispense:  42 tablet    Refill:  0    Order Specific Question:   Supervising Provider    Answer:   Merrilee Jansky [id number]  . albuterol (VENTOLIN HFA) 108 (90 Base) MCG/ACT inhaler    Sig: Inhale 2 puffs into the lungs every 4 (four) hours as needed for wheezing or shortness of breath.    Dispense:  18 g    Refill:  0    Order Specific Question:   Supervising Provider    Answer:   Merrilee Jansky [id number]  . azithromycin (ZITHROMAX) 250 MG tablet    Sig: Take 1 tablet (250 mg total) by mouth daily. Take first 2 tablets together, then 1 every day until finished.    Dispense:  6 tablet    Refill:  0    Order Specific Question:   Supervising Provider    Answer:   Merrilee Jansky [id number]  . benzonatate (TESSALON) 100 MG capsule    Sig: Take 1 capsule (100 mg total) by mouth every 8 (eight) hours.    Dispense:  21 capsule    Refill:  0    Order Specific Question:   Supervising Provider    Answer:   Merrilee Jansky [[id number]]   Highly suspicious of Covid pneumonia Declines CXR today Prescribed albuterol inhaler Prescribed steroid taper Prescribed azithromycin Prescribed tessalon perles  Get plenty of rest and push fluids Use OTC zyrtec for nasal congestion, runny nose, and/or sore throat Use OTC flonase for nasal congestion and runny nose Use medications daily for symptom relief Use OTC medications like ibuprofen or tylenol as needed fever or pain Call or go to the ED if you have any new or worsening symptoms such as fever, worsening cough, shortness of breath, chest tightness, chest pain, turning blue, changes in mental status.  Reviewed expectations  re: course of current medical issues. Questions answered. Outlined signs and symptoms indicating need for more acute intervention. Patient verbalized understanding. After Visit Summary given.         Moshe Cipro, NP 07/25/20 1405

## 2020-07-30 ENCOUNTER — Telehealth: Payer: BC Managed Care – PPO | Admitting: Nurse Practitioner

## 2020-07-30 DIAGNOSIS — U071 COVID-19: Secondary | ICD-10-CM | POA: Diagnosis not present

## 2020-07-30 MED ORDER — AZITHROMYCIN 500 MG PO TABS: 500.0000 mg | ORAL_TABLET | Freq: Every day | ORAL | 0 refills | Status: DC

## 2020-07-30 NOTE — Progress Notes (Signed)
E-Visit for Corona Virus Screening  We are sorry you are not feeling well. We are here to help!  You have tested positive for COVID-19, meaning that you were infected with the novel coronavirus and could give the germ to others.    You have been enrolled in MyChart Home Monitoring for COVID-19. Daily you will receive a questionnaire within the MyChart website. Our COVID-19 response team will be monitoring your responses daily.  Please continue isolation at home, for at least 10 days since the start of your symptoms and until you have had 24 hours with no fever (without taking a fever reducer) and with improving of symptoms.  Please continue good preventive care measures, including:  frequent hand-washing, avoid touching your face, cover coughs/sneezes, stay out of crowds and keep a 6 foot distance from others.  Follow up with your provider or go to the nearest hospital ED for re-assessment if fever/cough/breathlessness return.  The following symptoms may appear 2-14 days after exposure:  Fever  Cough  Shortness of breath or difficulty breathing  Chills  Repeated shaking with chills  Muscle pain  Headache  Sore throat  New loss of taste or smell  Fatigue  Congestion or runny nose  Nausea or vomiting  Diarrhea  Go to the nearest hospital ED for assessment if fever/cough/breathlessness are severe or illness seems like a threat to life.  It is vitally important that if you feel that you have an infection such as this virus or any other virus that you stay home and away from places where you may spread it to others.  You should avoid contact with people age 20 and older.     You may also take acetaminophen (Tylenol) as needed for fever.  Reduce your risk of any infection by using the same precautions used for avoiding the common cold or flu:   Wash your hands often with soap and warm water for at least 20 seconds.  If soap and water are not readily available, use an  alcohol-based hand sanitizer with at least 60% alcohol.   If coughing or sneezing, cover your mouth and nose by coughing or sneezing into the elbow areas of your shirt or coat, into a tissue or into your sleeve (not your hands).  Avoid shaking hands with others and consider head nods or verbal greetings only.  Avoid touching your eyes, nose, or mouth with unwashed hands.   Avoid close contact with people who are sick.  Avoid places or events with large numbers of people in one location, like concerts or sporting events.  Carefully consider travel plans you have or are making.  If you are planning any travel outside or inside the [place], visit the CDC's Travelers' Health webpage for the latest health notices.  If you have some symptoms but not all symptoms, continue to monitor at home and seek medical attention if your symptoms worsen.  If you are having a medical emergency, call 911.  HOME CARE  Only take medications as instructed by your medical team.  Drink plenty of fluids and get plenty of rest.  A steam or ultrasonic humidifier can help if you have congestion.   GET HELP RIGHT AWAY IF YOU HAVE EMERGENCY WARNING SIGNS** FOR COVID-19. If you or someone is showing any of these signs seek emergency medical care immediately. Call 911 or proceed to your closest emergency facility if:  You develop worsening high fever.  Trouble breathing  Bluish lips or face  Persistent pain or pressure  in the chest  New confusion  Inability to wake or stay awake  You cough up blood.  Your symptoms become more severe  **This list is not all possible symptoms. Contact your medical provider for any symptoms that are sever or concerning to you.  MAKE SURE YOU   Understand these instructions.  Will watch your condition.  Will get help right away if you are not doing well or get worse.  Your e-visit answers were reviewed by a board certified advanced clinical practitioner to complete your  personal care plan.  Depending on the condition, your plan could have included both over the counter or prescription medications.  If there is a problem please reply once you have received a response from your provider.  Your safety is important to [place].  If you have drug allergies check your prescription carefully.    You can use MyChart to ask questions about today's visit, request a non-urgent call back, or ask for a work or school excuse for 24 hours related to this e-Visit. If it has been greater than 24 hours you will need to follow up with your provider, or enter a new e-Visit to address those concerns. You will get an e-mail in the next two days asking about your experience.  I hope that your e-visit has been valuable and will speed your recovery. Thank you for using e-visits.      I have rx zithromax 500mg  daily for 3 days. This should help.  5-10 minutes spent reviewing and documenting in chart.

## 2021-07-15 ENCOUNTER — Telehealth: Payer: BC Managed Care – PPO | Admitting: Nurse Practitioner

## 2021-07-15 DIAGNOSIS — J069 Acute upper respiratory infection, unspecified: Secondary | ICD-10-CM

## 2021-07-15 MED ORDER — FLUTICASONE PROPIONATE 50 MCG/ACT NA SUSP: 2.0000 | Freq: Every day | NASAL | 0 refills | Status: AC

## 2021-07-15 NOTE — Progress Notes (Signed)
E-Visit for Upper Respiratory Infection   We are sorry you are not feeling well.  Here is how we plan to help!  Based on what you have shared with me, it looks like you may have a viral upper respiratory infection.  Upper respiratory infections are caused by a large number of viruses; however, rhinovirus is the most common cause.   If you have been around someone that has been diagnosed with COVID you should continue to test for 3-5 days after exposure.   Symptoms vary from person to person, with common symptoms including sore throat, cough, fatigue or lack of energy and feeling of general discomfort.  A low-grade fever of up to 100.4 may present, but is often uncommon.  Symptoms vary however, and are closely related to a person's age or underlying illnesses.  The most common symptoms associated with an upper respiratory infection are nasal discharge or congestion, cough, sneezing, headache and pressure in the ears and face.  These symptoms usually persist for about 3 to 10 days, but can last up to 2 weeks.  It is important to know that upper respiratory infections do not cause serious illness or complications in most cases.    Upper respiratory infections can be transmitted from person to person, with the most common method of transmission being a person's hands.  The virus is able to live on the skin and can infect other persons for up to 2 hours after direct contact.  Also, these can be transmitted when someone coughs or sneezes; thus, it is important to cover the mouth to reduce this risk.  To keep the spread of the illness at bay, good hand hygiene is very important.  This is an infection that is most likely caused by a virus. There are no specific treatments other than to help you with the symptoms until the infection runs its course.  We are sorry you are not feeling well.  Here is how we plan to help!   For nasal congestion, you may use an oral decongestants such as Mucinex D or if you have  glaucoma or high blood pressure use plain Mucinex.  Saline nasal spray or nasal drops can help and can safely be used as often as needed for congestion.  For your congestion, I have prescribed Fluticasone nasal spray one spray in each nostril twice a day  If you do not have a history of heart disease, hypertension, diabetes or thyroid disease, prostate/bladder issues or glaucoma, you may also use Sudafed to treat nasal congestion.  It is highly recommended that you consult with a pharmacist or your primary care physician to ensure this medication is safe for you to take.     If you have a cough, you may use cough suppressants such as Delsym and Robitussin.  If you have glaucoma or high blood pressure, you can also use Coricidin HBP.     If you have a sore or scratchy throat, use a saltwater gargle-  to  teaspoon of salt dissolved in a 4-ounce to 8-ounce glass of warm water.  Gargle the solution for approximately 15-30 seconds and then spit.  It is important not to swallow the solution.  You can also use throat lozenges/cough drops and Chloraseptic spray to help with throat pain or discomfort.  Warm or cold liquids can also be helpful in relieving throat pain.  For headache, pain or general discomfort, you can use Ibuprofen or Tylenol as directed.   Some authorities believe that zinc sprays  or the use of Echinacea may shorten the course of your symptoms.   HOME CARE Only take medications as instructed by your medical team. Be sure to drink plenty of fluids. Water is fine as well as fruit juices, sodas and electrolyte beverages. You may want to stay away from caffeine or alcohol. If you are nauseated, try taking small sips of liquids. How do you know if you are getting enough fluid? Your urine should be a pale yellow or almost colorless. Get rest. Taking a steamy shower or using a humidifier may help nasal congestion and ease sore throat pain. You can place a towel over your head and breathe in the  steam from hot water coming from a faucet. Using a saline nasal spray works much the same way. Cough drops, hard candies and sore throat lozenges may ease your cough. Avoid close contacts especially the very young and the elderly Cover your mouth if you cough or sneeze Always remember to wash your hands.   GET HELP RIGHT AWAY IF: You develop worsening fever. If your symptoms do not improve within 10 days You develop yellow or green discharge from your nose over 3 days. You have coughing fits You develop a severe head ache or visual changes. You develop shortness of breath, difficulty breathing or start having chest pain Your symptoms persist after you have completed your treatment plan  MAKE SURE YOU  Understand these instructions. Will watch your condition. Will get help right away if you are not doing well or get worse.  Thank you for choosing an e-visit.  Your e-visit answers were reviewed by a board certified advanced clinical practitioner to complete your personal care plan. Depending upon the condition, your plan could have included both over the counter or prescription medications.  Please review your pharmacy choice. Make sure the pharmacy is open so you can pick up prescription now. If there is a problem, you may contact your provider through [employer] and have the prescription routed to another pharmacy.  Your safety is important to [place]. If you have drug allergies check your prescription carefully.   For the next 24 hours you can use MyChart to ask questions about today's visit, request a non-urgent call back, or ask for a work or school excuse. You will get an email in the next two days asking about your experience. I hope that your e-visit has been valuable and will speed your recovery.  I spent approximately 7 minutes reviewing the patient's history, current symptoms and coordinating their plan of care today.    Meds ordered this encounter  Medications    fluticasone (FLONASE) 50 MCG/ACT nasal spray    Sig: Place 2 sprays into both nostrils daily.    Dispense:  16 g    Refill:  0

## 2021-08-06 ENCOUNTER — Other Ambulatory Visit: Payer: Self-pay

## 2021-08-06 ENCOUNTER — Ambulatory Visit
Admission: RE | Admit: 2021-08-06 | Discharge: 2021-08-06 | Disposition: A | Discharge status: Home / Self Care | Payer: BC Managed Care – PPO | Source: Ambulatory Visit | Attending: Emergency Medicine | Admitting: Emergency Medicine

## 2021-08-06 VITALS — BP 127/88 | HR 85 | Temp 98.7°F | Resp 18

## 2021-08-06 DIAGNOSIS — J22 Unspecified acute lower respiratory infection: Secondary | ICD-10-CM | POA: Diagnosis not present

## 2021-08-06 MED ORDER — AZITHROMYCIN 250 MG PO TABS: ORAL_TABLET | ORAL | 0 refills | Status: AC

## 2021-08-06 MED ORDER — PREDNISONE 10 MG PO TABS: ORAL_TABLET | ORAL | 0 refills | Status: AC

## 2021-08-06 NOTE — ED Triage Notes (Signed)
Pt here with productive cough and nasal congestion x 1 week. Covid tests negative

## 2021-08-06 NOTE — Discharge Instructions (Signed)
Take azithromycin and prednisone as prescribed.  Rest, push lots of fluids (especially water), and utilize supportive care for symptoms. You may take take acetaminophen (Tylenol) every 4-6 hours or ibuprofen every 6-8 hours for muscle pain, joint pain, headaches. Mucinex (guaifenesin) may be taken over the counter for cough as needed and can loosen phlegm. Please read the instructions and take as directed. Saline nasal sprays to rinse congestion can help as well. Warm tea with lemon and honey can sooth sore throat and cough, as can cough drops.  Return to clinic for new-onset fever, difficulty breathing, chest pain, symptoms lasting >3 to 4 weeks, or bloody sputum.

## 2021-08-06 NOTE — ED Provider Notes (Signed)
CHIEF COMPLAINT:   Chief Complaint  Patient presents with   Cough   Nasal Congestion     SUBJECTIVE/HPI:  HPI A very pleasant 25 y.o.Male presents today with productive cough and nasal congestion for the last 1 week.  Patient reports having taken COVID-19 test at home which were negative.  Patient reports a history of pneumonia three times in the past.  Patient does not report any shortness of breath, chest pain, palpitations, visual changes, weakness, tingling, headache, nausea, vomiting, diarrhea, fever, chills.   has a past medical history of Allergy.  ROS:  Review of Systems See Subjective/HPI Medications, Allergies and Problem List personally reviewed in Epic today OBJECTIVE:   Vitals:   08/06/21 1324  BP: 127/88  Pulse: 85  Resp: 18  Temp: 98.7 F (37.1 C)  SpO2: 98%    Physical Exam   General: Appears well-developed and well-nourished. No acute distress.  HEENT Head: Normocephalic and atraumatic.   Ears: Hearing grossly intact, no drainage or visible deformity.  Nose: No nasal deviation.   Mouth/Throat: No stridor or tracheal deviation.  Non erythematous posterior pharynx noted with clear drainage present.  No white patchy exudate noted. Eyes: Conjunctivae and EOM are normal. No eye drainage or scleral icterus bilaterally.  Neck: Normal range of motion, neck is supple.  Cardiovascular: Normal rate. Regular rhythm; no murmurs, gallops, or rubs.  Pulm/Chest: No respiratory distress.  Mild rhonchi noted over bilateral lower lung fields.  Bilateral upper lung field CTA. Neurological: Alert and oriented to person, place, and time.  Skin: Skin is warm and dry.  No rashes, lesions, abrasions or bruising noted to skin.   Psychiatric: Normal mood, affect, behavior, and thought content.   Vital signs and nursing note reviewed.   Patient stable and cooperative with examination. PROCEDURES:    LABS/X-RAYS/EKG/MEDS:   No results found for any visits on  08/06/21.  MEDICAL DECISION MAKING:   Patient presents with productive cough and nasal congestion for the last 1 week.  Patient reports having taken COVID-19 test at home which were negative.  Patient reports a history of pneumonia three times in the past.  Patient does not report any shortness of breath, chest pain, palpitations, visual changes, weakness, tingling, headache, nausea, vomiting, diarrhea, fever, chills.  Given symptoms along with assessment findings and history of pneumonia, will cover with azithromycin, prednisone and advised use of an albuterol inhaler for which he already has at home.  Covering for a lower respiratory tract infection.  Advised about home treatment and care as outlined in the AVS to include rest, fluids, Tylenol versus ibuprofen along with Mucinex.  Return to clinic for any new onset fever, difficulty breathing, chest pain, symptoms lasting longer than 3 to 4 weeks or bloody sputum.  Patient verbalized understanding and agreed with treatment plan.  Patient stable upon discharge. ASSESSMENT/PLAN:  1. Lower respiratory tract infection - azithromycin (ZITHROMAX Z-PAK) 250 MG tablet; Take 2 tablets (500 mg total) by mouth daily for 1 day, THEN 1 tablet (250 mg total) daily for 4 days.  Dispense: 6 tablet; Refill: 0 - predniSONE (DELTASONE) 10 MG tablet; Take 6 tablets (60 mg total) by mouth daily for 1 day, THEN 5 tablets (50 mg total) daily for 1 day, THEN 4 tablets (40 mg total) daily for 1 day, THEN 3 tablets (30 mg total) daily for 1 day, THEN 2 tablets (20 mg total) daily for 1 day, THEN 1 tablet (10 mg total) daily for 1 day.  Dispense: 21 tablet;  Refill: 0 Instructions about new medications and side effects provided.  Plan:   Discharge Instructions      Take azithromycin and prednisone as prescribed.  Rest, push lots of fluids (especially water), and utilize supportive care for symptoms. You may take take acetaminophen (Tylenol) every 4-6 hours or ibuprofen  every 6-8 hours for muscle pain, joint pain, headaches. Mucinex (guaifenesin) may be taken over the counter for cough as needed and can loosen phlegm. Please read the instructions and take as directed. Saline nasal sprays to rinse congestion can help as well. Warm tea with lemon and honey can sooth sore throat and cough, as can cough drops.  Return to clinic for new-onset fever, difficulty breathing, chest pain, symptoms lasting >3 to 4 weeks, or bloody sputum.          Amalia Greenhouse, FNP 08/06/21 1344

## 2021-09-28 ENCOUNTER — Other Ambulatory Visit: Payer: Self-pay

## 2021-09-28 ENCOUNTER — Ambulatory Visit (HOSPITAL_COMMUNITY)
Admission: EM | Admit: 2021-09-28 | Discharge: 2021-09-28 | Disposition: A | Discharge status: Home / Self Care | Payer: BC Managed Care – PPO | Attending: Emergency Medicine | Admitting: Emergency Medicine

## 2021-09-28 ENCOUNTER — Encounter (HOSPITAL_COMMUNITY): Payer: Self-pay

## 2021-09-28 DIAGNOSIS — J069 Acute upper respiratory infection, unspecified: Secondary | ICD-10-CM

## 2021-09-28 MED ORDER — AZITHROMYCIN 250 MG PO TABS: 250.0000 mg | ORAL_TABLET | Freq: Every day | ORAL | 0 refills | Status: AC

## 2021-09-28 MED ORDER — IPRATROPIUM BROMIDE 0.06 % NA SOLN: 2.0000 | Freq: Four times a day (QID) | NASAL | 12 refills | Status: AC

## 2021-09-28 NOTE — ED Provider Notes (Signed)
[facility]    CSN: [id number] Arrival date & time: 09/28/21  1016    HISTORY   Chief Complaint  Patient presents with   Fever    Coughing and congestion x 1 week   HPI Zachary Preston is a 25 y.o. male. Patient reports feeling sick days ago, began to have sore throat nasal congestion and cough.  Patient states the sore throat has largely resolved itself but continues to have significant amount of nasal drainage that is dark brown at times as well as cough productive of similar colored sputum.  Patient does not have any fever at this time.  Vital signs are normal on arrival today.  Patient states that at this point he feels like he should be feeling better however he does not and almost feels that he is getting worse.  Patient states has been taking DayQuil with some relief before but now feels it is not working as well, thinks he might of become immune to it.  The history is provided by the patient.  Past Medical History:  Diagnosis Date   Allergy    Patient Active Problem List   Diagnosis Date Noted   Suspected COVID-19 virus infection 07/05/2019   Cough 10/04/2018   Visit for well man health check 06/22/2016   Past Surgical History:  Procedure Laterality Date   arm fracture     tonsillectomy      Home Medications    Prior to Admission medications   Medication Sig Start Date End Date Taking? Authorizing Provider  azithromycin (ZITHROMAX) 250 MG tablet Take 1 tablet (250 mg total) by mouth daily. Take first 2 tablets together, then 1 every day until finished. 09/28/21  Yes Theadora Rama Scales, PA-C  ipratropium (ATROVENT) 0.06 % nasal spray Place 2 sprays into both nostrils 4 (four) times daily. As needed for nasal congestion, runny nose 09/28/21  Yes Theadora Rama Scales, PA-C  albuterol (VENTOLIN HFA) 108 (90 Base) MCG/ACT inhaler Inhale 2 puffs into the lungs every 4 (four) hours as needed for wheezing or shortness of breath. 07/24/20   Moshe Cipro, NP  fexofenadine (ALLEGRA) 180 MG tablet Take 180 mg by mouth daily.    [provider]  fluticasone (FLONASE) 50 MCG/ACT nasal spray Place 2 sprays into both nostrils daily. 07/15/21   Viviano Simas, FNP   Family History Family History  Problem Relation Age of Onset   Healthy Mother    Hypertension Father    Social History Social History   Tobacco Use   Smoking status: Never   Smokeless tobacco: Never  Substance Use Topics   Alcohol use: Yes   Drug use: Never   Allergies   Patient has no known allergies.  Review of Systems Review of Systems Pertinent findings noted in history of present illness.   Physical Exam Triage Vital Signs ED Triage Vitals  Enc Vitals Group     BP 08/29/21 0827 (!) 147/82     Pulse Rate 08/29/21 0827 72     Resp 08/29/21 0827 18     Temp 08/29/21 0827 98.3 F (36.8 C)     Temp Source 08/29/21 0827 Oral     SpO2 08/29/21 0827 98 %     Weight --      Height --      Head Circumference --      Peak Flow --      Pain Score 08/29/21 0826 5     Pain Loc --  Pain Edu? --      Excl. in [place]? --   No data found.  Updated Vital Signs BP 125/82 (BP Location: Left Arm)   Pulse 82   Temp 97.7 F (36.5 C) (Oral)   SpO2 100%   Physical Exam Vitals and nursing note reviewed.  Constitutional:      General: He is not in acute distress.    Appearance: Normal appearance. He is not ill-appearing.  HENT:     Head: Normocephalic and atraumatic.     Salivary Glands: Right salivary gland is diffusely enlarged. Right salivary gland is not tender. Left salivary gland is diffusely enlarged. Left salivary gland is not tender.     Right Ear: Tympanic membrane, ear canal and external ear normal. No drainage. No middle ear effusion. There is no impacted cerumen. Tympanic membrane is not erythematous or bulging.     Left Ear: Tympanic membrane, ear canal and external ear normal. No drainage.  No middle ear effusion. There is no impacted  cerumen. Tympanic membrane is not erythematous or bulging.     Nose: Mucosal edema, congestion and rhinorrhea present. No nasal deformity or septal deviation. Rhinorrhea is purulent.     Right Nostril: No epistaxis.     Left Nostril: No epistaxis or occlusion.     Right Turbinates: Enlarged and swollen. Not pale.     Left Turbinates: Enlarged and swollen. Not pale.     Right Sinus: Frontal sinus tenderness present. No maxillary sinus tenderness.     Left Sinus: Frontal sinus tenderness present. No maxillary sinus tenderness.     Mouth/Throat:     Lips: Pink. No lesions.     Mouth: Mucous membranes are moist. No oral lesions.     Pharynx: Oropharynx is clear. Uvula midline. No posterior oropharyngeal erythema or uvula swelling.     Tonsils: No tonsillar exudate. 0 on the right. 0 on the left.  Eyes:     General: Lids are normal.        Right eye: No discharge.        Left eye: No discharge.     Extraocular Movements: Extraocular movements intact.     Conjunctiva/sclera: Conjunctivae normal.     Right eye: Right conjunctiva is not injected.     Left eye: Left conjunctiva is not injected.  Neck:     Trachea: Trachea and phonation normal.  Cardiovascular:     Rate and Rhythm: Normal rate and regular rhythm.     Pulses: Normal pulses.     Heart sounds: Normal heart sounds. No murmur heard.   No friction rub. No gallop.  Pulmonary:     Effort: Pulmonary effort is normal. No accessory muscle usage, prolonged expiration or respiratory distress.     Breath sounds: Normal breath sounds. No stridor, decreased air movement or transmitted upper airway sounds. No decreased breath sounds, wheezing, rhonchi or rales.  Chest:     Chest wall: No tenderness.  Musculoskeletal:        General: Normal range of motion.     Cervical back: Normal range of motion and neck supple. Normal range of motion.  Lymphadenopathy:     Cervical: No cervical adenopathy.  Skin:    General: Skin is warm and dry.      Findings: No erythema or rash.  Neurological:     General: No focal deficit present.     Mental Status: He is alert and oriented to person, place, and time.  Psychiatric:  Mood and Affect: Mood normal.        Behavior: Behavior normal.    Visual Acuity Right Eye Distance:   Left Eye Distance:   Bilateral Distance:    Right Eye Near:   Left Eye Near:    Bilateral Near:     UC Couse / Diagnostics / Procedures:    EKG  Radiology No results found.  Procedures Procedures (including critical care time)  UC Diagnoses / Final Clinical Impressions(s)   I have reviewed the triage vital signs and the nursing notes.  Pertinent labs & imaging results that were available during my care of the patient were reviewed by me and considered in my medical decision making (see chart for details).   Final diagnoses:  Upper respiratory tract infection, unspecified type   Patient advised that we will begin a broad-spectrum antibiotic that covers upper respiratory pathogens and to take it as prescribed.  Patient also provided with a prescription for Atrovent nasal spray to dry music mucous membranes and reduce cough.  Patient also advised ibuprofen would be a good addition to his regimen at this time.  Conservative care recommended.  Disposition Upon Discharge:  Condition: stable for discharge home Home: take medications as prescribed; routine discharge instructions as discussed; follow up as advised.  Patient presented with an acute illness with associated systemic symptoms and significant discomfort requiring urgent management. In my opinion, this is a condition that a prudent lay person (someone who possesses an average knowledge of health and medicine) may potentially expect to result in complications if not addressed urgently such as respiratory distress, impairment of bodily function or dysfunction of bodily organs.   Routine symptom specific, illness specific and/or disease specific  instructions were discussed with the patient and/or caregiver at length.   As such, the patient has been evaluated and assessed, work-up was performed and treatment was provided in alignment with urgent care protocols and evidence based medicine.  Patient/parent/caregiver has been advised that the patient may require follow up for further testing and treatment if the symptoms continue in spite of treatment, as clinically indicated and appropriate.  The patient was tested for COVID-19, Influenza and/or RSV, then the patient/parent/guardian was advised to isolate at home pending the results of his/her diagnostic coronavirus test and potentially longer if they're positive. I have also advised pt that if his/her COVID-19 test returns positive, it's recommended to self-isolate for at least 10 days after symptoms first appeared AND until fever-free for 24 hours without fever reducer AND other symptoms have improved or resolved. Discussed self-isolation recommendations as well as instructions for household member/close contacts as per the [facility] and [place] DHHS, and also gave patient the [place] packet with this information.  Patient/parent/caregiver has been advised to return to the [facility] or PCP in 3-5 days if no better; to PCP or the Emergency Department if new signs and symptoms develop, or if the current signs or symptoms continue to change or worsen for further workup, evaluation and treatment as clinically indicated and appropriate  The patient will follow up with their current PCP if and as advised. If the patient does not currently have a PCP we will assist them in obtaining one.   The patient may need specialty follow up if the symptoms continue, in spite of conservative treatment and management, for further workup, evaluation, consultation and treatment as clinically indicated and appropriate.  Patient/parent/caregiver verbalized understanding and agreement of plan as discussed.  All questions were addressed  during visit.  Please see  discharge instructions below for further details of plan.  ED Prescriptions     Medication Sig Dispense Auth. Provider   azithromycin (ZITHROMAX) 250 MG tablet Take 1 tablet (250 mg total) by mouth daily. Take first 2 tablets together, then 1 every day until finished. 6 tablet Theadora Rama Scales, PA-C   ipratropium (ATROVENT) 0.06 % nasal spray Place 2 sprays into both nostrils 4 (four) times daily. As needed for nasal congestion, runny nose 15 mL Theadora Rama Scales, PA-C      PDMP not reviewed this encounter.  Pending results:  Labs Reviewed - No data to display  Medications Ordered in UC: Medications - No data to display  Discharge Instructions:   Discharge Instructions      Your symptoms are most consistent with a viral upper respiratory illness that is lingered a little too long.  At this point, I believe that a broad-spectrum upper respiratory antibiotic such as azithromycin would provide you with the benefit that you need to get through the last stages of this infection.    I also recommend that you begin Atrovent nasal spray, 2 sprays in each nare up to 4 times daily as needed, this will reduce your nasal congestion and nasal drainage, hopefully reduce your cough as well, he is safe to use this along with your Flonase.Marland Kitchen  Please remain home from work, school, public places until you have been fever free for 24 hours without the use of antifever medications such as Tylenol or ibuprofen.  Conservative care is recommended at this time.  This includes rest, pushing clear fluids and activity as tolerated.  You may also noticed that your appetite is reduced, this is okay as long as they are drinking plenty of clear fluids.  Acetaminophen (Tylenol): This is a good fever reducer.  If there body temperature rises above 101.5 as measured with a thermometer, it is recommended that you give them 1,000 mg every 6-8 hours until they are temperature falls  below 101.5, please not take more than 3,000 mg of acetaminophen either as a separate medication or as in ingredient in an over-the-counter cold/flu preparation within a 24-hour period  Ibuprofen  (Advil, Motrin): This is a good anti-inflammatory medication which addresses aches and pains and, to some degree, congestion in the nasal passages.  I recommend giving between 400 to 600 mg every 6-8 hours as needed.  Pseudoephedrine (Sudafed): This is a decongestant.  This medication has to be purchased from the pharmacist counter, I recommend giving 2 tablets, 60 mg, 2-3 times a day as needed to relieve runny nose and sinus drainage.  Guaifenesin (Robitussin, Mucinex): This is an expectorant.  This helps break up chest congestion and loosen up thick nasal drainage making phlegm and drainage more liquid and therefore easier to remove.  I recommend being 400 mg three times daily as needed.  Dextromethorphan (any cough medicine with the letters "DM" added to it's name such as Robitussin DM): This is a cough suppressant.  This is often recommended to be taken at nighttime to suppress cough and help children sleep.  Give dosage as directed on the bottle.   Chloraseptic Throat Spray: Spray 5 sprays into affected area every 2 hours, hold for 15 seconds and either swallow or spit it out.  This is a excellent numbing medication because it is a spray, you can put it right where you needed and so sucking on a lozenge and numbing your entire mouth.  Please follow-up within the next 3 to 5  days either with your primary care provider or urgent care if your symptoms do not resolve.  If you do not have a primary care provider, we will assist you in finding one.         Lynden Oxford Scales, PA-C 09/28/21 1149

## 2021-09-28 NOTE — ED Triage Notes (Signed)
Pt here with productive cough and congestion x 1 week.

## 2021-09-28 NOTE — Discharge Instructions (Addendum)
Your symptoms are most consistent with a viral upper respiratory illness that is lingered a little too long.  At this point, I believe that a broad-spectrum upper respiratory antibiotic such as azithromycin would provide you with the benefit that you need to get through the last stages of this infection.    I also recommend that you begin Atrovent nasal spray, 2 sprays in each nare up to 4 times daily as needed, this will reduce your nasal congestion and nasal drainage, hopefully reduce your cough as well, he is safe to use this along with your Flonase.Marland Kitchen  Please remain home from work, school, public places until you have been fever free for 24 hours without the use of antifever medications such as Tylenol or ibuprofen.  Conservative care is recommended at this time.  This includes rest, pushing clear fluids and activity as tolerated.  You may also noticed that your appetite is reduced, this is okay as long as they are drinking plenty of clear fluids.  Acetaminophen (Tylenol): This is a good fever reducer.  If there body temperature rises above 101.5 as measured with a thermometer, it is recommended that you give them 1,000 mg every 6-8 hours until they are temperature falls below 101.5, please not take more than 3,000 mg of acetaminophen either as a separate medication or as in ingredient in an over-the-counter cold/flu preparation within a 24-hour period  Ibuprofen  (Advil, Motrin): This is a good anti-inflammatory medication which addresses aches and pains and, to some degree, congestion in the nasal passages.  I recommend giving between 400 to 600 mg every 6-8 hours as needed.  Pseudoephedrine (Sudafed): This is a decongestant.  This medication has to be purchased from the pharmacist counter, I recommend giving 2 tablets, 60 mg, 2-3 times a day as needed to relieve runny nose and sinus drainage.  Guaifenesin (Robitussin, Mucinex): This is an expectorant.  This helps break up chest congestion and  loosen up thick nasal drainage making phlegm and drainage more liquid and therefore easier to remove.  I recommend being 400 mg three times daily as needed.  Dextromethorphan (any cough medicine with the letters "DM" added to it's name such as Robitussin DM): This is a cough suppressant.  This is often recommended to be taken at nighttime to suppress cough and help children sleep.  Give dosage as directed on the bottle.   Chloraseptic Throat Spray: Spray 5 sprays into affected area every 2 hours, hold for 15 seconds and either swallow or spit it out.  This is a excellent numbing medication because it is a spray, you can put it right where you needed and so sucking on a lozenge and numbing your entire mouth.  Please follow-up within the next 3 to 5 days either with your primary care provider or urgent care if your symptoms do not resolve.  If you do not have a primary care provider, we will assist you in finding one.

## 2023-01-09 ENCOUNTER — Telehealth: Payer: BC Managed Care – PPO | Admitting: Nurse Practitioner

## 2023-01-09 DIAGNOSIS — J029 Acute pharyngitis, unspecified: Secondary | ICD-10-CM

## 2023-01-09 MED ORDER — AMOXICILLIN 875 MG PO TABS: 875.0000 mg | ORAL_TABLET | Freq: Two times a day (BID) | ORAL | 0 refills | Status: AC

## 2023-01-09 NOTE — Progress Notes (Signed)
Virtual Visit Consent   Zachary Preston, you are scheduled for a virtual visit with Mary-Margaret Hassell Done, [place], a [facility] provider, today.     Just as with appointments in the office, your consent must be obtained to participate.  Your consent will be active for this visit and any virtual visit you may have with one of our providers in the next 365 days.     If you have a MyChart account, a copy of this consent can be sent to you electronically.  All virtual visits are billed to your insurance company just like a traditional visit in the office.    As this is a virtual visit, video technology does not allow for your provider to perform a traditional examination.  This may limit your provider's ability to fully assess your condition.  If your provider identifies any concerns that need to be evaluated in person or the need to arrange testing (such as labs, EKG, etc.), we will make arrangements to do so.     Although advances in technology are sophisticated, we cannot ensure that it will always work on either your end or our end.  If the connection with a video visit is poor, the visit may have to be switched to a telephone visit.  With either a video or telephone visit, we are not always able to ensure that we have a secure connection.     I need to obtain your verbal consent now.   Are you willing to proceed with your visit today? YES   Zachary Preston has provided verbal consent on 01/09/2023 for a virtual visit (video or telephone).   Mary-Margaret Hassell Done, FNP   Date: 01/09/2023 12:14 PM   Virtual Visit via Video Note   I, Mary-Margaret Hassell Done, connected with Zachary Preston, Zachary Preston, 1997) on 01/09/23 at  2:15 PM EST by a video-enabled telemedicine application and verified that I am speaking with the correct person using two identifiers.  Location: Patient: Virtual Visit Location Patient: Home Provider: Virtual Visit Location Provider: Mobile   I discussed the limitations of  evaluation and management by telemedicine and the availability of in person appointments. The patient expressed understanding and agreed to proceed.    History of Present Illness: Zachary Preston is a 27 y.o. who identifies as a male who was assigned male at birth, and is being seen today for  cough and congestion.   HPI: Sore Throat  This is a new problem. Episode onset: 2 days ago. The problem has been waxing and waning. The maximum temperature recorded prior to his arrival was 101 - 101.9 F. The pain is mild. Associated symptoms include congestion, a hoarse voice and trouble swallowing. Pertinent negatives include no coughing. He has had no exposure to strep. He has tried NSAIDs for the symptoms. The treatment provided mild relief.    Review of Systems  HENT:  Positive for congestion, hoarse voice and trouble swallowing.   Respiratory:  Negative for cough.     Problems:  Patient Active Problem List   Diagnosis Date Noted   Suspected COVID-19 virus infection 07/05/2019   Cough 10/04/2018   Visit for well man health check 06/22/2016    Allergies: No Known Allergies Medications:  Current Outpatient Medications:    albuterol (VENTOLIN HFA) 108 (90 Base) MCG/ACT inhaler, Inhale 2 puffs into the lungs every 4 (four) hours as needed for wheezing or shortness of breath., Disp: 18 g, Rfl: 0   azithromycin (ZITHROMAX) 250 MG  tablet, Take 1 tablet (250 mg total) by mouth daily. Take first 2 tablets together, then 1 every day until finished., Disp: 6 tablet, Rfl: 0   fexofenadine (ALLEGRA) 180 MG tablet, Take 180 mg by mouth daily., Disp: , Rfl:    fluticasone (FLONASE) 50 MCG/ACT nasal spray, Place 2 sprays into both nostrils daily., Disp: 16 g, Rfl: 0   ipratropium (ATROVENT) 0.06 % nasal spray, Place 2 sprays into both nostrils 4 (four) times daily. As needed for nasal congestion, runny nose, Disp: 15 mL, Rfl: 12  Observations/Objective: Patient is well-developed, well-nourished in no acute  distress.  Resting comfortably  at home.  Head is normocephalic, atraumatic.  No labored breathing.  Speech is clear and coherent with logical content.  Patient is alert and oriented at baseline.  Raspy voice  Assessment and Plan:  Zachary Preston in today with chief complaint of Cough and Nasal Congestion   1. Pharyngitis, unspecified etiology Force fluids Motrin or tylenol OTC OTC decongestant Throat lozenges if help New toothbrush in 3 days    Follow Up Instructions: I discussed the assessment and treatment plan with the patient. The patient was provided an opportunity to ask questions and all were answered. The patient agreed with the plan and demonstrated an understanding of the instructions.  A copy of instructions were sent to the patient via MyChart.  The patient was advised to call back or seek an in-person evaluation if the symptoms worsen or if the condition fails to improve as anticipated.  Time:  I spent 13 minutes with the patient via telehealth technology discussing the above problems/concerns.    Mary-Margaret Hassell Done, FNP

## 2023-01-09 NOTE — Patient Instructions (Signed)
  Zachary Preston, thank you for joining Chevis Pretty, FNP for today's virtual visit.  While this provider is not your primary care provider (PCP), if your PCP is located in our provider database this encounter information will be shared with them immediately following your visit.   [facility]ach[facility] Preston account gives you access to today's visit and all your visits, tests, and labs performed at [facility] " click here if you don't have a [facility] account or go to [email or web address]  Consent: (Patient) Zachary Preston provided verbal consent for this virtual visit at the beginning of the encounter.  Current Medications:  Current Outpatient Medications:    amoxicillin (AMOXIL) 875 MG tablet, Take 1 tablet (875 mg total) by mouth 2 (two) times daily. 1 po BID, Disp: 20 tablet, Rfl: 0   albuterol (VENTOLIN HFA) 108 (90 Base) MCG/ACT inhaler, Inhale 2 puffs into the lungs every 4 (four) hours as needed for wheezing or shortness of breath., Disp: 18 g, Rfl: 0   azithromycin (ZITHROMAX) 250 MG tablet, Take 1 tablet (250 mg total) by mouth daily. Take first 2 tablets together, then 1 every day until finished., Disp: 6 tablet, Rfl: 0   fexofenadine (ALLEGRA) 180 MG tablet, Take 180 mg by mouth daily., Disp: , Rfl:    fluticasone (FLONASE) 50 MCG/ACT nasal spray, Place 2 sprays into both nostrils daily., Disp: 16 g, Rfl: 0   ipratropium (ATROVENT) 0.06 % nasal spray, Place 2 sprays into both nostrils 4 (four) times daily. As needed for nasal congestion, runny nose, Disp: 15 mL, Rfl: 12   Medications ordered in this encounter:  Meds ordered this encounter  Medications   amoxicillin (AMOXIL) 875 MG tablet    Sig: Take 1 tablet (875 mg total) by mouth 2 (two) times daily. 1 po BID    Dispense:  20 tablet    Refill:  0    Order Specific Question:   Supervising Provider    Answer:   Chase Picket [id number]     *If you need refills on other medications prior to  your next appointment, please contact your pharmacy*  Follow-Up: Call back or seek an in-person evaluation if the symptoms worsen or if the condition fails to improve as anticipated.  [facility] [phone]  Other Instructions Force fluids Motrin or tylenol OTC OTC decongestant Throat lozenges if help New toothbrush in 3 days    If you have been instructed to have an in-person evaluation today at a local Urgent Care facility, please use the link below. It will take you to a list of all of our available [facility], including address, phone number and hours of operation. Please do not delay care.  [facility]  If you or a family member do not have a primary care provider, use the link below to schedule a visit and establish care. When you choose a [facility] primary care physician or advanced practice provider, you gain a long-term partner in health. Find a Primary Care Provider  Learn more about [facility]'s in-office and virtual care options: [facility]
